# Patient Record
Sex: Female | Born: 1995 | Race: Black or African American | Hispanic: No | Marital: Single | Smoking: Never smoker
Health system: Southern US, Community
[De-identification: ages and names within clinical notes are randomized; demographics above are authoritative.]

## PROBLEM LIST (undated history)

## (undated) DIAGNOSIS — N289 Disorder of kidney and ureter, unspecified: Secondary | ICD-10-CM

## (undated) DIAGNOSIS — R809 Proteinuria, unspecified: Secondary | ICD-10-CM

---
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
## 2017-01-15 ENCOUNTER — Emergency Department (HOSPITAL_COMMUNITY)
Admission: EM | Admit: 2017-01-15 | Discharge: 2017-01-15 | Disposition: A | Discharge status: Home / Self Care | Attending: Emergency Medicine | Admitting: Emergency Medicine

## 2017-01-15 ENCOUNTER — Encounter (HOSPITAL_COMMUNITY): Payer: Self-pay | Admitting: *Deleted

## 2017-01-15 DIAGNOSIS — J029 Acute pharyngitis, unspecified: Secondary | ICD-10-CM | POA: Insufficient documentation

## 2017-01-15 HISTORY — DX: Disorder of kidney and ureter, unspecified: N28.9

## 2017-01-15 LAB — RAPID STREP SCREEN (MED CTR MEBANE ONLY): Streptococcus, Group A Screen (Direct): NEGATIVE

## 2017-01-15 MED ORDER — DEXAMETHASONE SODIUM PHOSPHATE 10 MG/ML IJ SOLN
10.0000 mg | Freq: Once | INTRAMUSCULAR | Status: AC
  Administered 2017-01-15: 10 mg via INTRAMUSCULAR
  Filled 2017-01-15: qty 1

## 2017-01-15 NOTE — Discharge Instructions (Signed)
Take ibuprofen and Tylenol as needed for pain and aches. Use warm water salt gargles, warm teas, honey, and over-the-counter lozenges for your sore throat. Drink plenty of fluids and get plenty of rest. Follow-up with primary care physician if symptoms persist. Return to the ED immediately if any concerning signs or symptoms develop such as throat tightness, facial swelling, difficulty breathing or swallowing, or drooling.

## 2017-01-15 NOTE — ED Notes (Signed)
Declined W/C at D/C and was escorted to lobby by RN. 

## 2017-01-15 NOTE — ED Triage Notes (Signed)
PT reports a sore throat that started 3 days ago. Pt reports there are no other Sx's. Pt reports she had a throat infection this time last year.

## 2017-01-15 NOTE — ED Provider Notes (Signed)
[facility] Provider Note   CSN: [id number] Arrival date & time: 01/15/17  1245     History   Chief Complaint Chief Complaint  Patient presents with  . Sore Throat    HPI Candice Henry is a 21 y.o. female who presents with chief complaint acute onset, progressively worsening sore throat for 3 days. She endorses pain with swallowing, however she states she is able to get food and drink down without difficulty. No aggravating or alleviating factors noted. She denies fevers, chills, SOB, cough, nasal congestion, ear pain, headaches, CP, abdominal pain, n/v/d. She has not tried anything for her symptoms.  The history is provided by the patient.    Past Medical History:  Diagnosis Date  . Renal disorder    Chronic protien uria    There are no active problems to display for this patient.   History reviewed. No pertinent surgical history.  OB History    No data available       Home Medications    Prior to Admission medications   Medication Sig Start Date End Date Taking? Authorizing Provider  amoxicillin (AMOXIL) 500 MG tablet Take 2 tablets (1,000 mg total) by mouth 2 (two) times daily. 03/29/14   Wayland Salinas, MD  fluticasone (CUTIVATE) 0.005 % ointment Apply 1 application topically 2 (two) times daily. Patient taking differently: Apply 1 application topically 2 (two) times daily. Apply to face 02/15/14   Linna Hoff, MD  HYDROcodone-acetaminophen (NORCO) 5-325 MG per tablet Take 2 tablets by mouth every 4 (four) hours as needed. 03/29/14   Wayland Salinas, MD  metoCLOPramide (REGLAN) 10 MG tablet Take 1 tablet (10 mg total) by mouth every 6 (six) hours as needed for nausea (nausea/headache). 03/29/14   Wayland Salinas, MD  ondansetron (ZOFRAN ODT) 8 MG disintegrating tablet 8mg  ODT q4 hours prn nausea 03/29/14   Wayland Salinas, MD  predniSONE (DELTASONE) 20 MG tablet 2 tabs po daily x 3 days 03/29/14   Wayland Salinas, MD    Family History History reviewed. No pertinent  family history.  Social History Social History  Substance Use Topics  . Smoking status: Never Smoker  . Smokeless tobacco: Never Used  . Alcohol use Yes     Comment: social     Allergies   Other   Review of Systems Review of Systems  Constitutional: Negative for chills and fever.  HENT: Positive for sore throat and trouble swallowing. Negative for congestion and facial swelling.   Respiratory: Negative for shortness of breath.   Cardiovascular: Negative for chest pain.  Gastrointestinal: Negative for abdominal pain, diarrhea, nausea and vomiting.  Musculoskeletal: Negative for myalgias, neck pain and neck stiffness.  Neurological: Negative for headaches.     Physical Exam Updated Vital Signs BP 121/76 (BP Location: Right Arm)   Pulse 73   Temp 98.2 F (36.8 C) (Oral)   Resp 16   Ht 5\' 4"  (1.626 m)   Wt 69.4 kg (153 lb)   LMP 01/15/2017   SpO2 100%   BMI 26.26 kg/m   Physical Exam  Constitutional: She appears well-developed and well-nourished. No distress.  Resting comfortably in bed  HENT:  Head: Normocephalic and atraumatic.  Right Ear: External ear normal.  Left Ear: External ear normal.  No frontal or maxillary sinus TTP. TMs normal bilaterally without erythema or bulging. Nasal septum is midline with pink mucosa and no nasal congestion. Posterior oropharynx with mild erythema and tonsillar hypertrophy. No tonsillar exudates or uvular deviation. No trismus or  sublingual abnormalities.  Eyes: Conjunctivae are normal. Right eye exhibits no discharge. Left eye exhibits no discharge.  Neck: Normal range of motion. Neck supple. No JVD present. No tracheal deviation present.  Cardiovascular: Normal rate.   Pulmonary/Chest: Effort normal.  Abdominal: She exhibits no distension.  Musculoskeletal: She exhibits no edema.  Lymphadenopathy:    She has no cervical adenopathy.  Neurological: She is alert.  Skin: Skin is warm and dry. No erythema.  Psychiatric: She has  a normal mood and affect. Her behavior is normal.  Nursing note and vitals reviewed.    ED Treatments / Results  Labs (all labs ordered are listed, but only abnormal results are displayed) Labs Reviewed  RAPID STREP SCREEN (NOT AT [facility])  CULTURE, GROUP A STREP [facility])    EKG  EKG Interpretation None       Radiology No results found.  Procedures Procedures (including critical care time)  Medications Ordered in ED Medications  dexamethasone (DECADRON) injection 10 mg (10 mg Intramuscular Given 01/15/17 1415)     Initial Impression / Assessment and Plan / ED Course  I have reviewed the triage vital signs and the nursing notes.  Pertinent labs & imaging results that were available during my care of the patient were reviewed by me and considered in my medical decision making (see chart for details).     Pt afebrile without tonsillar exudate, negative strep. Presents with mild dysphagia; diagnosis of viral pharyngitis. No abx indicated. DC w symptomatic tx for pain. Pt does not appear dehydrated, but did discuss importance of water rehydration. Presentation non concerning for PTA or infxn spread to soft tissue. No trismus or uvula deviation. Pt able to drink water in ED without difficulty with intact air way. Recommended PCP follow up. Discussed indications for return to the ED. Pt verbalized understanding of and agreement with plan and is safe for discharge home at this time.  Final Clinical Impressions(s) / ED Diagnoses   Final diagnoses:  Viral pharyngitis    New Prescriptions New Prescriptions   No medications on file     Bennye Alm 01/15/17 1436    Geoffery Lyons, MD 01/15/17 [phone]

## 2017-01-17 LAB — CULTURE, GROUP A STREP (THRC)

## 2017-03-17 ENCOUNTER — Encounter (HOSPITAL_COMMUNITY): Payer: Self-pay

## 2017-03-17 ENCOUNTER — Emergency Department (HOSPITAL_COMMUNITY)
Admission: EM | Admit: 2017-03-17 | Discharge: 2017-03-18 | Disposition: A | Discharge status: Home / Self Care | Payer: Self-pay | Attending: Emergency Medicine | Admitting: Emergency Medicine

## 2017-03-17 DIAGNOSIS — Z79899 Other long term (current) drug therapy: Secondary | ICD-10-CM | POA: Insufficient documentation

## 2017-03-17 DIAGNOSIS — J029 Acute pharyngitis, unspecified: Secondary | ICD-10-CM | POA: Insufficient documentation

## 2017-03-17 LAB — RAPID STREP SCREEN (MED CTR MEBANE ONLY): STREPTOCOCCUS, GROUP A SCREEN (DIRECT): NEGATIVE

## 2017-03-17 MED ORDER — DEXAMETHASONE SODIUM PHOSPHATE 10 MG/ML IJ SOLN
10.0000 mg | Freq: Once | INTRAMUSCULAR | Status: AC
  Administered 2017-03-17: 10 mg via INTRAMUSCULAR
  Filled 2017-03-17: qty 1

## 2017-03-17 MED ORDER — AMOXICILLIN 500 MG PO CAPS: 500.0000 mg | ORAL_CAPSULE | Freq: Two times a day (BID) | ORAL | 0 refills | Status: AC

## 2017-03-17 NOTE — Discharge Instructions (Signed)
It was my pleasure taking care of you today!   Please take all of your antibiotics until finished!   Discard your toothbrush and begin using a new one in 3 days.   For sore throat, take ibuprofen every 6 hours as needed.   Follow up with your doctor in 2-3 days if no improvement.   Return to the ED sooner for worsening condition, inability to swallow, breathing difficulty, new concerns.

## 2017-03-17 NOTE — ED Triage Notes (Signed)
Pt states sore throat X3 days. She reports hx of strep throat. Tonsillar inflammation and some white patches noted. Voice slightly hoarse. Airway intact.

## 2017-03-17 NOTE — ED Provider Notes (Signed)
[facility] EMERGENCY DEPARTMENT Provider Note   CSN: [id number] Arrival date & time: 03/17/17  5784     History   Chief Complaint Chief Complaint  Patient presents with  . Sore Throat    HPI Candice Henry is a 21 y.o. female.  The history is provided by the patient and medical records. No language interpreter was used.  Sore Throat  Pertinent negatives include no chest pain, no abdominal pain, no headaches and no shortness of breath.   Candice Henry is a 21 y.o. female who presents to the Emergency Department complaining of constant, persistent  sore throat x 3 days. Pain worse when swallowing. OTC pain medication tried with little improvement. No fever, chills, neck pain, abdominal pain, n/v/d, chest pain, shortness of breath. Able to tolerate fluids and soft foods without difficulty. 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   Past Medical History:  Diagnosis Date  . Renal disorder    Chronic protien uria    There are no active problems to display for this patient.   History reviewed. No pertinent surgical history.  OB History    No data available       Home Medications    Prior to Admission medications   Medication Sig Start Date End Date Taking? Authorizing Provider  amoxicillin (AMOXIL) 500 MG capsule Take 1 capsule (500 mg total) by mouth 2 (two) times daily. 03/17/17   Raedyn Klinck, Chase Picket, PA-C  fluticasone (CUTIVATE) 0.005 % ointment Apply 1 application topically 2 (two) times daily. Patient taking differently: Apply 1 application topically 2 (two) times daily. Apply to face 02/15/14   Linna Hoff, MD  HYDROcodone-acetaminophen (NORCO) 5-325 MG per tablet Take 2 tablets by mouth every 4 (four) hours as needed. 03/29/14   Wayland Salinas, MD  metoCLOPramide (REGLAN) 10 MG tablet Take 1 tablet (10 mg total) by mouth every 6 (six) hours as needed for nausea (nausea/headache).  03/29/14   Wayland Salinas, MD  ondansetron (ZOFRAN ODT) 8 MG disintegrating tablet 8mg  ODT q4 hours prn nausea 03/29/14   Wayland Salinas, MD  predniSONE (DELTASONE) 20 MG tablet 2 tabs po daily x 3 days 03/29/14   Wayland Salinas, MD    Family History History reviewed. No pertinent family history.  Social History Social History  Substance Use Topics  . Smoking status: Never Smoker  . Smokeless tobacco: Never Used  . Alcohol use Yes     Comment: social     Allergies   Other   Review of Systems Review of Systems  Constitutional: Negative for chills and fever.  HENT: Positive for congestion and sore throat. Negative for facial swelling and trouble swallowing.   Respiratory: Negative for cough and shortness of breath.   Cardiovascular: Negative for chest pain.  Gastrointestinal: Negative for abdominal pain, diarrhea, nausea and vomiting.  Musculoskeletal: Negative for neck pain.  Allergic/Immunologic: Negative for immunocompromised state.  Neurological: Negative for headaches.     Physical Exam Updated Vital Signs BP 120/84 (BP Location: Left Arm)   Pulse 78   Temp 98.5 F (36.9 C) (Oral)   Resp 16   LMP 02/10/2017 (Approximate)   SpO2 100%   Physical Exam  Constitutional: She appears well-developed and well-nourished. No distress.  HENT:  Head: Normocephalic and atraumatic.  OP with erythema, tonsillar hypertrophy and bilateral exudates. No PTA. Uvula midline.  Neck: Neck supple.  Cardiovascular: Normal rate, regular rhythm and normal  heart sounds.   No murmur heard. Pulmonary/Chest: Effort normal and breath sounds normal. No respiratory distress. She has no wheezes. She has no rales.  Musculoskeletal: Normal range of motion.  Neurological: She is alert.  Skin: Skin is warm and dry.  Nursing note and vitals reviewed.    ED Treatments / Results  Labs (all labs ordered are listed, but only abnormal results are displayed) Labs Reviewed  RAPID STREP SCREEN (NOT AT  [facility])  CULTURE, GROUP A STREP [facility])    EKG  EKG Interpretation None       Radiology No results found.  Procedures Procedures (including critical care time)  Medications Ordered in ED Medications  dexamethasone (DECADRON) injection 10 mg (not administered)     Initial Impression / Assessment and Plan / ED Course  I have reviewed the triage vital signs and the nursing notes.  Pertinent labs & imaging results that were available during my care of the patient were reviewed by me and considered in my medical decision making (see chart for details).    Candice CouncilmanJulian Alcivar is a 21 y.o. female who presents to ED for sore throat x 3 days. On exam, patient is afebrile, hemodynamically stable with clear lung exam. OP with erythema, tonsillar hypertrophy and bilateral exudates. Midline uvula. No PTA. Tolerating PO and secretions fine. Rapid strep negative, but given exam findings, will start on amoxil. Follow up with PCP in 2-3 days if no improvement. Reasons to return to ER discussed and all questions answered.   Final Clinical Impressions(s) / ED Diagnoses   Final diagnoses:  Exudative pharyngitis    New Prescriptions New Prescriptions   AMOXICILLIN (AMOXIL) 500 MG CAPSULE    Take 1 capsule (500 mg total) by mouth 2 (two) times daily.     Majesta Leichter, Chase PicketJaime Pilcher, PA-C 03/17/17 [id number]    Pricilla LovelessGoldston, Scott, MD 03/17/17 [phone]

## 2017-03-19 LAB — CULTURE, GROUP A STREP (THRC)

## 2017-07-09 ENCOUNTER — Other Ambulatory Visit: Payer: Self-pay

## 2017-07-09 ENCOUNTER — Ambulatory Visit (HOSPITAL_COMMUNITY)
Admission: EM | Admit: 2017-07-09 | Discharge: 2017-07-09 | Disposition: A | Discharge status: Home / Self Care | Payer: Self-pay | Attending: Family Medicine | Admitting: Family Medicine

## 2017-07-09 ENCOUNTER — Encounter (HOSPITAL_COMMUNITY): Payer: Self-pay | Admitting: Emergency Medicine

## 2017-07-09 DIAGNOSIS — R112 Nausea with vomiting, unspecified: Secondary | ICD-10-CM

## 2017-07-09 MED ORDER — ONDANSETRON 4 MG PO TBDP: 4.0000 mg | ORAL_TABLET | Freq: Three times a day (TID) | ORAL | 0 refills | Status: AC | PRN

## 2017-07-09 NOTE — ED Triage Notes (Signed)
Pt states yesterday she thought she had food poisoning and was vomiting a lot, now shes been having diarrhea and body aches.

## 2017-07-12 NOTE — ED Provider Notes (Signed)
  [facility]   [id number] 07/09/17 Arrival Time: 1938  ASSESSMENT & PLAN:  1. Nausea and vomiting, intractability of vomiting not specified, unspecified vomiting type    Meds ordered this encounter  Medications  . ondansetron (ZOFRAN-ODT) 4 MG disintegrating tablet    Sig: Take 1 tablet (4 mg total) by mouth every 8 (eight) hours as needed for nausea or vomiting.    Dispense:  15 tablet    Refill:  0  Suspect viral etiology. Will do her best to ensure adequate fluid intake in order to avoid dehydration. Will proceed to the Emergency Department for evaluation if unable to tolerate PO fluids regularly.  Otherwise she ill f/u with his PCP or here if not showing improvement over the next 48-72 hours.  Reviewed expectations re: course of current medical issues. Questions answered. Outlined signs and symptoms indicating need for more acute intervention. Patient verbalized understanding. After Visit Summary given.   SUBJECTIVE: History from: patient.  Candice Henry is a 22 y.o. female who presents with complaint of intermittent nausea and vomiting of brown material. Onset abrupt, yesterday. Abdominal discomfort: mild and cramping. Symptoms are gradually improving since beginning. Some loose stools today. Aggravating factors: eating. Alleviating factors: none. Associated symptoms: anorexia. She denies chills and fever. Appetite: decreased. PO intake: decreased. Ambulatory without assistance. Urinary symptoms: none. Last bowel movement today without blood. OTC treatment: none.  Patient's last menstrual period was 06/15/2017.  History reviewed. No pertinent surgical history.  ROS: As per HPI.  OBJECTIVE:  Vitals:   07/09/17 2026  BP: 122/83  Pulse: 94  Resp: 18  Temp: 99 F (37.2 C)  SpO2: 98%    General appearance: alert; no distress Lungs: clear to auscultation bilaterally Heart: regular rate and rhythm Abdomen: soft; non-distended; no significant abdominal  tenderness, "just a cramping feeling"; bowel sounds present; no masses or organomegaly; no guarding or rebound tenderness Back: no CVA tenderness Extremities: no edema; symmetrical with no gross deformities Skin: warm and dry Neurologic: normal gait Psychological: alert and cooperative; normal mood and affect  Allergies  Allergen Reactions  . Other Rash    Face breaks out                                               Past Medical History:  Diagnosis Date  . Renal disorder    Chronic protien uria   Social History   Socioeconomic History  . Marital status: Single    Spouse name: Not on file  . Number of children: Not on file  . Years of education: Not on file  . Highest education level: Not on file  Social Needs  . Financial resource strain: Not on file  . Food insecurity - worry: Not on file  . Food insecurity - inability: Not on file  . Transportation needs - medical: Not on file  . Transportation needs - non-medical: Not on file  Occupational History  . Not on file  Tobacco Use  . Smoking status: Never Smoker  . Smokeless tobacco: Never Used  Substance and Sexual Activity  . Alcohol use: Yes    Comment: social  . Drug use: No  . Sexual activity: No  Other Topics Concern  . Not on file  Social History Narrative  . Not on file     Mardella LaymanHagler, Gaile Allmon, MD 07/12/17 1158

## 2017-08-07 ENCOUNTER — Encounter (HOSPITAL_COMMUNITY): Payer: Self-pay | Admitting: Emergency Medicine

## 2017-08-07 DIAGNOSIS — R109 Unspecified abdominal pain: Secondary | ICD-10-CM | POA: Insufficient documentation

## 2017-08-07 DIAGNOSIS — Z5321 Procedure and treatment not carried out due to patient leaving prior to being seen by health care provider: Secondary | ICD-10-CM | POA: Insufficient documentation

## 2017-08-07 LAB — URINALYSIS, ROUTINE W REFLEX MICROSCOPIC
Bilirubin Urine: NEGATIVE
Glucose, UA: NEGATIVE mg/dL
Hgb urine dipstick: NEGATIVE
Ketones, ur: NEGATIVE mg/dL
LEUKOCYTES UA: NEGATIVE
Nitrite: NEGATIVE
PH: 5 (ref 5.0–8.0)
Protein, ur: NEGATIVE mg/dL
Specific Gravity, Urine: 1.024 (ref 1.005–1.030)

## 2017-08-07 LAB — I-STAT BETA HCG BLOOD, ED (MC, WL, AP ONLY): I-stat hCG, quantitative: <5 m[IU]/mL (ref <5)

## 2017-08-07 LAB — BASIC METABOLIC PANEL
Anion gap: 11 (ref 5–15)
BUN: 15 mg/dL (ref 6–20)
CHLORIDE: 106 mmol/L (ref 101–111)
CO2: 21 mmol/L — ABNORMAL LOW (ref 22–32)
Calcium: 9.3 mg/dL (ref 8.9–10.3)
Creatinine, Ser: 0.97 mg/dL (ref 0.44–1.00)
GFR calc Af Amer: >60 mL/min (ref >60)
Glucose, Bld: 82 mg/dL (ref 65–99)
POTASSIUM: 4.2 mmol/L (ref 3.5–5.1)
SODIUM: 138 mmol/L (ref 135–145)

## 2017-08-07 LAB — CBC
HCT: 37.3 % (ref 36.0–46.0)
Hemoglobin: 12.4 g/dL (ref 12.0–15.0)
MCH: 31.1 pg (ref 26.0–34.0)
MCHC: 33.2 g/dL (ref 30.0–36.0)
MCV: 93.5 fL (ref 78.0–100.0)
PLATELETS: 270 10*3/uL (ref 150–400)
RBC: 3.99 MIL/uL (ref 3.87–5.11)
RDW: 12.7 % (ref 11.5–15.5)
WBC: 6 10*3/uL (ref 4.0–10.5)

## 2017-08-07 NOTE — ED Triage Notes (Signed)
Pt states right sided flank pain since Sunday radiating into the left, denies painful urination. Pain 6/10. Denies vaginal symptoms. LMP 2/22.

## 2017-08-08 ENCOUNTER — Encounter (HOSPITAL_COMMUNITY): Payer: Self-pay | Admitting: Emergency Medicine

## 2017-08-08 ENCOUNTER — Emergency Department (HOSPITAL_COMMUNITY)
Admission: EM | Admit: 2017-08-08 | Discharge: 2017-08-08 | Disposition: A | Discharge status: Home / Self Care | Payer: Self-pay | Attending: Emergency Medicine | Admitting: Emergency Medicine

## 2017-08-08 DIAGNOSIS — R1031 Right lower quadrant pain: Secondary | ICD-10-CM | POA: Insufficient documentation

## 2017-08-08 DIAGNOSIS — R109 Unspecified abdominal pain: Secondary | ICD-10-CM

## 2017-08-08 HISTORY — DX: Proteinuria, unspecified: R80.9

## 2017-08-08 LAB — CBC
HCT: 38 % (ref 36.0–46.0)
HEMOGLOBIN: 12.4 g/dL (ref 12.0–15.0)
MCH: 30.7 pg (ref 26.0–34.0)
MCHC: 32.6 g/dL (ref 30.0–36.0)
MCV: 94.1 fL (ref 78.0–100.0)
Platelets: 281 10*3/uL (ref 150–400)
RBC: 4.04 MIL/uL (ref 3.87–5.11)
RDW: 12.8 % (ref 11.5–15.5)
WBC: 6.6 10*3/uL (ref 4.0–10.5)

## 2017-08-08 LAB — URINALYSIS, ROUTINE W REFLEX MICROSCOPIC
Bilirubin Urine: NEGATIVE
Glucose, UA: NEGATIVE mg/dL
HGB URINE DIPSTICK: NEGATIVE
Ketones, ur: 5 mg/dL — AB
Leukocytes, UA: NEGATIVE
Nitrite: NEGATIVE
PH: 6 (ref 5.0–8.0)
Protein, ur: NEGATIVE mg/dL
SPECIFIC GRAVITY, URINE: 1.02 (ref 1.005–1.030)

## 2017-08-08 LAB — COMPREHENSIVE METABOLIC PANEL
ALT: 12 U/L — ABNORMAL LOW (ref 14–54)
AST: 16 U/L (ref 15–41)
Albumin: 4 g/dL (ref 3.5–5.0)
Alkaline Phosphatase: 70 U/L (ref 38–126)
Anion gap: 8 (ref 5–15)
BUN: 18 mg/dL (ref 6–20)
CO2: 29 mmol/L (ref 22–32)
Calcium: 9.6 mg/dL (ref 8.9–10.3)
Chloride: 103 mmol/L (ref 101–111)
Creatinine, Ser: 0.88 mg/dL (ref 0.44–1.00)
GFR calc Af Amer: >60 mL/min (ref >60)
GFR calc non Af Amer: >60 mL/min (ref >60)
Glucose, Bld: 84 mg/dL (ref 65–99)
Potassium: 3.8 mmol/L (ref 3.5–5.1)
Sodium: 140 mmol/L (ref 135–145)
Total Bilirubin: 1 mg/dL (ref 0.3–1.2)
Total Protein: 7.5 g/dL (ref 6.5–8.1)

## 2017-08-08 LAB — LIPASE, BLOOD: Lipase: 43 U/L (ref 11–51)

## 2017-08-08 LAB — I-STAT BETA HCG BLOOD, ED (MC, WL, AP ONLY): I-stat hCG, quantitative: <5 m[IU]/mL (ref <5)

## 2017-08-08 NOTE — ED Triage Notes (Signed)
Patient c/o bilateral flank pain with burning in abdomen x5 days. Denies N/V/D. Denies urinary sx.

## 2017-08-08 NOTE — ED Notes (Signed)
Bed: WTR8 Expected date:  Expected time:  Means of arrival:  Comments: 

## 2017-08-08 NOTE — ED Provider Notes (Signed)
[facility]-EMERGENCY DEPT Provider Note   CSN: [id number] Arrival date & time: 08/08/17  1354     History   Chief Complaint Chief Complaint  Patient presents with  . Abdominal Pain  . Flank Pain    HPI Candice Henry is a 22 y.o. female.  HPI   Patient is a 22 year old female with history of postural proteinuria who presents the ED to be evaluated for right flank pain that began 5 days ago.  Patient states pain was initially located bilaterally, however now is only located in the right flank. Pain does not radiate. Rates pain 8/10, however states that it has resolved since she arrived to the emergency department.  Prior to resolution, pain was constant and felt like burning/aching.  States she has had this pain in the past, but has never persisted for this long.  States that in the past she will have pain when she is dehydrated, and symptoms are usually resolved after she rehydrates.  States she has been drinking water to help improve her pain.  She also reports a burning pain to the epigastric region that was initially 6/10, but has now improved and she is no longer having pain.  She denies any chest pain or shortness of breath.  She denies any history of acid reflux.  Denies eating spicy foods.  She denies any nausea, vomiting, diarrhea, blood in stool, fevers, chest pain, shortness of breath, no numbness, weakness, tingling to the bilateral lower extremities.  Denies any pain to the middle of her back.  Denies any urinary or vaginal symptoms including no sick dysuria, frequency, hematuria, discoloration to her urine.  Denies any vaginal bleeding, vaginal discharge, vaginal irritation.  No pelvic pain. LMP 2/22.  She reports that she has a history of postural proteinuria and has been [facility] for several years.  She was last seen about 1 year ago.  States she has had multiple ultrasounds of the kidneys in the past which have been negative  for any anatomical abnormality.  Past Medical History:  Diagnosis Date  . Proteinuria   . Renal disorder    Chronic protien uria    There are no active problems to display for this patient.   History reviewed. No pertinent surgical history.  OB History    No data available       Home Medications    Prior to Admission medications   Medication Sig Start Date End Date Taking? Authorizing Provider  amoxicillin (AMOXIL) 500 MG capsule Take 1 capsule (500 mg total) by mouth 2 (two) times daily. 03/17/17   Ward, Chase PicketJaime Pilcher, PA-C  fluticasone (CUTIVATE) 0.005 % ointment Apply 1 application topically 2 (two) times daily. Patient taking differently: Apply 1 application topically 2 (two) times daily. Apply to face 02/15/14   Linna HoffKindl, James D, MD  HYDROcodone-acetaminophen (NORCO) 5-325 MG per tablet Take 2 tablets by mouth every 4 (four) hours as needed. 03/29/14   Wayland SalinasBednar, John, MD  metoCLOPramide (REGLAN) 10 MG tablet Take 1 tablet (10 mg total) by mouth every 6 (six) hours as needed for nausea (nausea/headache). 03/29/14   Wayland SalinasBednar, John, MD  ondansetron (ZOFRAN-ODT) 4 MG disintegrating tablet Take 1 tablet (4 mg total) by mouth every 8 (eight) hours as needed for nausea or vomiting. 07/09/17   Mardella LaymanHagler, Brian, MD  predniSONE (DELTASONE) 20 MG tablet 2 tabs po daily x 3 days 03/29/14   Wayland SalinasBednar, John, MD    Family History No family history on file.  Social History Social History   Tobacco Use  . Smoking status: Never Smoker  . Smokeless tobacco: Never Used  Substance Use Topics  . Alcohol use: Yes    Comment: social  . Drug use: No     Allergies   Other   Review of Systems Review of Systems  Constitutional: Negative for fever.  HENT: Negative for sore throat.   Eyes: Negative for visual disturbance.  Respiratory: Negative for shortness of breath.   Cardiovascular: Negative for chest pain.  Gastrointestinal: Positive for abdominal pain (resolved). Negative for blood in stool,  constipation, diarrhea, nausea and vomiting.  Genitourinary: Positive for flank pain. Negative for difficulty urinating, dysuria, frequency, hematuria, menstrual problem, pelvic pain, urgency, vaginal bleeding and vaginal discharge.  Musculoskeletal: Negative for neck pain.  Neurological: Negative for headaches.     Physical Exam Updated Vital Signs BP 129/87 (BP Location: Left Arm)   Pulse 74   Temp 98 F (36.7 C) (Oral)   Resp 18   Ht 5\' 4"  (1.626 m)   Wt 69.4 kg (153 lb)   LMP 07/18/2017   SpO2 100%   BMI 26.26 kg/m   Physical Exam  Constitutional: She appears well-developed and well-nourished. No distress.  Nontoxic, no acute distress  HENT:  Head: Normocephalic and atraumatic.  Eyes: Conjunctivae are normal.  Neck: Neck supple.  Cardiovascular: Normal rate, regular rhythm and normal heart sounds.  No murmur heard. Pulmonary/Chest: Effort normal and breath sounds normal. No respiratory distress. She has no wheezes.  Abdominal: Soft. Normal appearance and bowel sounds are normal. There is no tenderness. There is no rigidity, no rebound, no guarding and no CVA tenderness.  Musculoskeletal: She exhibits no edema.  Neurological: She is alert.  Skin: Skin is warm and dry.  Psychiatric: She has a normal mood and affect.  Nursing note and vitals reviewed.    ED Treatments / Results  Labs (all labs ordered are listed, but only abnormal results are displayed) Labs Reviewed  COMPREHENSIVE METABOLIC PANEL - Abnormal; Notable for the following components:      Result Value   ALT 12 (*)    All other components within normal limits  URINALYSIS, ROUTINE W REFLEX MICROSCOPIC - Abnormal; Notable for the following components:   Ketones, ur 5 (*)    All other components within normal limits  LIPASE, BLOOD  CBC  I-STAT BETA HCG BLOOD, ED ([facility], [facility], [facility] ONLY)    EKG  EKG Interpretation None       Radiology No results found.  Procedures Procedures (including critical  care time)  Medications Ordered in ED Medications - No data to display   Initial Impression / Assessment and Plan / ED Course  I have reviewed the triage vital signs and the nursing notes.  Pertinent labs & imaging results that were available during my care of the patient were reviewed by me and considered in my medical decision making (see chart for details).    Final Clinical Impressions(s) / ED Diagnoses   Final diagnoses:  Right flank pain   Patient is nontoxic, nonseptic appearing, in no apparent distress.  Patient's pain and other symptoms resolved spontaneously while she was waiting in the emergency department.  Labs and vitals reviewed and are within normal limits. Patient does not meet the SIRS or Sepsis criteria.  On exam patient does not have a surgical abdomin and there are no peritoneal signs.  No indication of appendicitis, bowel obstruction, bowel perforation, cholecystitis, diverticulitis, ectopic pregnancy. Doubt ovarian torsion  or abscess. Doubt nephrolithiasis. Patient discharged home with recommendation to call her nephrologist as well as PCP to inform them of visit and f/u. I have also discussed reasons to return immediately to the ER.  Patient expresses understanding and agrees with plan.  ED Discharge Orders    None       Rayne Du 08/08/17 2051    Vanetta Mulders, MD 08/09/17 [phone]

## 2017-08-08 NOTE — Discharge Instructions (Signed)
The results of your lab work today were within normal limits.  There was no protein in your urine and no evidence of a urinary tract infection.  Your white blood cell count was normal.  Your kidney function was normal, your electrolytes were normal.  Please follow-up with your primary care provider and your nephrologist to make them aware of your visit to the emergency department today.  Please return to the emergency department if you have any new or persistent symptoms including any of the following:  Abdominal pain that does not go away.  You have a fever.  You keep throwing up (vomiting).  The pain is felt only in portions of the abdomen. Pain in the right side could possibly be appendicitis. In an adult, pain in the left lower portion of the abdomen could be colitis or diverticulitis.  You pass bloody or black tarry stools.  There is bright red blood in the stool.  The constipation stays for more than 4 days.  There is belly (abdominal) or rectal pain.  You do not seem to be getting better.  You have any questions or concerns.

## 2017-08-08 NOTE — ED Notes (Signed)
Pt did not answer for room.  Pt's sitting near her known location stated that she left.

## 2017-08-11 ENCOUNTER — Other Ambulatory Visit: Payer: Self-pay

## 2017-08-11 ENCOUNTER — Emergency Department (HOSPITAL_COMMUNITY)
Admission: EM | Admit: 2017-08-11 | Discharge: 2017-08-11 | Disposition: A | Discharge status: Home / Self Care | Payer: Self-pay | Attending: Emergency Medicine | Admitting: Emergency Medicine

## 2017-08-11 ENCOUNTER — Emergency Department (HOSPITAL_COMMUNITY): Payer: Self-pay

## 2017-08-11 ENCOUNTER — Encounter (HOSPITAL_COMMUNITY): Payer: Self-pay | Admitting: Emergency Medicine

## 2017-08-11 DIAGNOSIS — R109 Unspecified abdominal pain: Secondary | ICD-10-CM

## 2017-08-11 DIAGNOSIS — R1011 Right upper quadrant pain: Secondary | ICD-10-CM | POA: Insufficient documentation

## 2017-08-11 LAB — URINALYSIS, ROUTINE W REFLEX MICROSCOPIC
Bilirubin Urine: NEGATIVE
Glucose, UA: NEGATIVE mg/dL
Hgb urine dipstick: NEGATIVE
Ketones, ur: 5 mg/dL — AB
Leukocytes, UA: NEGATIVE
NITRITE: NEGATIVE
Protein, ur: NEGATIVE mg/dL
SPECIFIC GRAVITY, URINE: 1.028 (ref 1.005–1.030)
pH: 6 (ref 5.0–8.0)

## 2017-08-11 LAB — BASIC METABOLIC PANEL
ANION GAP: 8 (ref 5–15)
BUN: 19 mg/dL (ref 6–20)
CALCIUM: 9.5 mg/dL (ref 8.9–10.3)
CO2: 26 mmol/L (ref 22–32)
Chloride: 103 mmol/L (ref 101–111)
Creatinine, Ser: 0.75 mg/dL (ref 0.44–1.00)
GLUCOSE: 93 mg/dL (ref 65–99)
Potassium: 3.8 mmol/L (ref 3.5–5.1)
Sodium: 137 mmol/L (ref 135–145)

## 2017-08-11 LAB — CBC
HCT: 38.9 % (ref 36.0–46.0)
HEMOGLOBIN: 12.6 g/dL (ref 12.0–15.0)
MCH: 30.7 pg (ref 26.0–34.0)
MCHC: 32.4 g/dL (ref 30.0–36.0)
MCV: 94.6 fL (ref 78.0–100.0)
Platelets: 283 10*3/uL (ref 150–400)
RBC: 4.11 MIL/uL (ref 3.87–5.11)
RDW: 13.1 % (ref 11.5–15.5)
WBC: 6.9 10*3/uL (ref 4.0–10.5)

## 2017-08-11 LAB — I-STAT BETA HCG BLOOD, ED (MC, WL, AP ONLY)

## 2017-08-11 MED ORDER — TRAMADOL HCL 50 MG PO TABS: 50.0000 mg | ORAL_TABLET | Freq: Four times a day (QID) | ORAL | 0 refills | Status: AC | PRN

## 2017-08-11 MED ORDER — SODIUM CHLORIDE 0.9 % IJ SOLN
INTRAMUSCULAR | Status: AC
  Filled 2017-08-11: qty 50

## 2017-08-11 MED ORDER — IOPAMIDOL (ISOVUE-300) INJECTION 61%
100.0000 mL | Freq: Once | INTRAVENOUS | Status: AC | PRN
  Administered 2017-08-11: 100 mL via INTRAVENOUS

## 2017-08-11 MED ORDER — SODIUM CHLORIDE 0.9 % IV BOLUS (SEPSIS)
1000.0000 mL | Freq: Once | INTRAVENOUS | Status: AC
  Administered 2017-08-11: 1000 mL via INTRAVENOUS

## 2017-08-11 MED ORDER — IOPAMIDOL (ISOVUE-300) INJECTION 61%
INTRAVENOUS | Status: AC
  Filled 2017-08-11: qty 100

## 2017-08-11 MED ORDER — KETOROLAC TROMETHAMINE 30 MG/ML IJ SOLN
30.0000 mg | Freq: Once | INTRAMUSCULAR | Status: AC
  Administered 2017-08-11: 30 mg via INTRAVENOUS
  Filled 2017-08-11: qty 1

## 2017-08-11 NOTE — Discharge Instructions (Signed)
Return here as needed. Follow up with the GI doctor provided.  °

## 2017-08-11 NOTE — ED Notes (Signed)
Bed: WTR5 Expected date:  Expected time:  Means of arrival:  Comments: 

## 2017-08-11 NOTE — ED Triage Notes (Signed)
Patient complaining of right flank pain and right lower abdominal pain that she has had since last Sunday. Patient is not complaining of any other symptoms.

## 2017-08-16 NOTE — ED Provider Notes (Signed)
[facility]-EMERGENCY DEPT Provider Note   CSN: [id number] Arrival date & time: 08/11/17  0133     History   Chief Complaint Chief Complaint  Patient presents with  . Flank Pain  . Abdominal Pain    HPI Candice Henry is a 22 y.o. female.  HPI Patient presents to the emergency department with right-sided flank pain that started 10 days ago.  The patient states she was seen previously for this 3 days ago.  Patient states that nothing seemed to make the condition better or worse.  Patient states that certain movements do seem to make it worse with palpation.  The patient denies chest pain, shortness of breath, headache,blurred vision, neck pain, fever, cough, weakness, numbness, dizziness, anorexia, edema, nausea, vomiting, diarrhea, rash, back pain, dysuria, hematemesis, bloody stool, near syncope, or syncope.  Patient states she was not given any medications in her prior visit for her symptoms. Past Medical History:  Diagnosis Date  . Proteinuria   . Renal disorder    Chronic protien uria    There are no active problems to display for this patient.   History reviewed. No pertinent surgical history.   OB History   None      Home Medications    Prior to Admission medications   Medication Sig Start Date End Date Taking? Authorizing Provider  ibuprofen (ADVIL,MOTRIN) 200 MG tablet Take 400 mg by mouth every 6 (six) hours as needed for moderate pain.   Yes [provider]  amoxicillin (AMOXIL) 500 MG capsule Take 1 capsule (500 mg total) by mouth 2 (two) times daily. Patient not taking: Reported on 08/11/2017 03/17/17   Ward, Chase Picket, PA-C  fluticasone (CUTIVATE) 0.005 % ointment Apply 1 application topically 2 (two) times daily. Patient not taking: Reported on 08/11/2017 02/15/14   Linna Hoff, MD  HYDROcodone-acetaminophen (NORCO) 5-325 MG per tablet Take 2 tablets by mouth every 4 (four) hours as needed. Patient not taking: Reported  on 08/11/2017 03/29/14   Wayland Salinas, MD  metoCLOPramide (REGLAN) 10 MG tablet Take 1 tablet (10 mg total) by mouth every 6 (six) hours as needed for nausea (nausea/headache). Patient not taking: Reported on 08/11/2017 03/29/14   Wayland Salinas, MD  ondansetron (ZOFRAN-ODT) 4 MG disintegrating tablet Take 1 tablet (4 mg total) by mouth every 8 (eight) hours as needed for nausea or vomiting. Patient not taking: Reported on 08/11/2017 07/09/17   Mardella Layman, MD  predniSONE (DELTASONE) 20 MG tablet 2 tabs po daily x 3 days Patient not taking: Reported on 08/11/2017 03/29/14   Wayland Salinas, MD  traMADol (ULTRAM) 50 MG tablet Take 1 tablet (50 mg total) by mouth every 6 (six) hours as needed for severe pain. 08/11/17   Charlestine Night, PA-C    Family History History reviewed. No pertinent family history.  Social History Social History   Tobacco Use  . Smoking status: Never Smoker  . Smokeless tobacco: Never Used  Substance Use Topics  . Alcohol use: Yes    Comment: social  . Drug use: No     Allergies   Other   Review of Systems Review of Systems All other systems negative except as documented in the HPI. All pertinent positives and negatives as reviewed in the HPI. Physical Exam Updated Vital Signs BP 110/77 (BP Location: Right Arm)   Pulse 81   Temp 97.9 F (36.6 C) (Oral)   Resp 16   Ht 5\' 4"  (1.626 m)   Wt 69.4 kg (153  lb)   LMP 07/18/2017 Comment: neg hcg test 08/11/2017  SpO2 98%   BMI 26.26 kg/m   Physical Exam  Constitutional: She is oriented to person, place, and time. She appears well-developed and well-nourished. No distress.  HENT:  Head: Normocephalic and atraumatic.  Mouth/Throat: Oropharynx is clear and moist.  Eyes: Pupils are equal, round, and reactive to light.  Neck: Normal range of motion. Neck supple.  Cardiovascular: Normal rate, regular rhythm and normal heart sounds. Exam reveals no gallop and no friction rub.  No murmur heard. Pulmonary/Chest:  Effort normal and breath sounds normal. No respiratory distress. She has no wheezes.  Abdominal: Soft. Bowel sounds are normal. She exhibits no distension. There is tenderness.    Neurological: She is alert and oriented to person, place, and time. She exhibits normal muscle tone. Coordination normal.  Skin: Skin is warm and dry. Capillary refill takes less than 2 seconds. No rash noted. No erythema.  Psychiatric: She has a normal mood and affect. Her behavior is normal.  Nursing note and vitals reviewed.    ED Treatments / Results  Labs (all labs ordered are listed, but only abnormal results are displayed) Labs Reviewed  URINALYSIS, ROUTINE W REFLEX MICROSCOPIC - Abnormal; Notable for the following components:      Result Value   Ketones, ur 5 (*)    Bacteria, UA RARE (*)    Squamous Epithelial / LPF 0-5 (*)    All other components within normal limits  BASIC METABOLIC PANEL  CBC  I-STAT BETA HCG BLOOD, ED ([facility], [facility], [facility] ONLY)    EKG None  Radiology No results found.  Procedures Procedures (including critical care time)  Medications Ordered in ED Medications  sodium chloride 0.9 % bolus 1,000 mL (0 mLs Intravenous Stopped 08/11/17 1021)  iopamidol (ISOVUE-300) 61 % injection 100 mL (100 mLs Intravenous Contrast Given 08/11/17 0836)  ketorolac (TORADOL) 30 MG/ML injection 30 mg (30 mg Intravenous Given 08/11/17 1022)     Initial Impression / Assessment and Plan / ED Course  I have reviewed the triage vital signs and the nursing notes.  Pertinent labs & imaging results that were available during my care of the patient were reviewed by me and considered in my medical decision making (see chart for details).    Patient CT scan did not show any significant abnormalities.  The patient will be treated for what may be a muscular/GI irritation.  Have advised the patient to return here for any worsening in her condition.  Given her follow-up with GI as well.  Patient agrees the plan  and all questions were answered.  Final Clinical Impressions(s) / ED Diagnoses   Final diagnoses:  Right flank pain    ED Discharge Orders        Ordered    traMADol (ULTRAM) 50 MG tablet  Every 6 hours PRN     08/11/17 [address]1109       Myria Steenbergen, PA-C 08/16/17 0050    Cathren LaineSteinl, Kevin, MD 08/16/17 [phone]

## 2018-08-11 IMAGING — CT CT ABD-PELV W/ CM
2 of 4 series · 17 of 46 positions shown, 19 images · IV contrast (ISOVUE)
Comparison: None.

CLINICAL DATA: Right flank pain and right lower abdominal pain 1
week.

EXAM:
CT ABDOMEN AND PELVIS WITH CONTRAST
TECHNIQUE: Multidetector CT imaging of the abdomen and pelvis was performed
using the standard protocol following bolus administration of
intravenous contrast.
CONTRAST:  100mL [id number] IOPAMIDOL ([id number]) INJECTION 61%

[Series 2: axial st · axial · 0.68mm/px · z∈[+1347,+1722]mm · 14 of 83 slices shown, 16 images]
[im 4/83  soft-tissue]
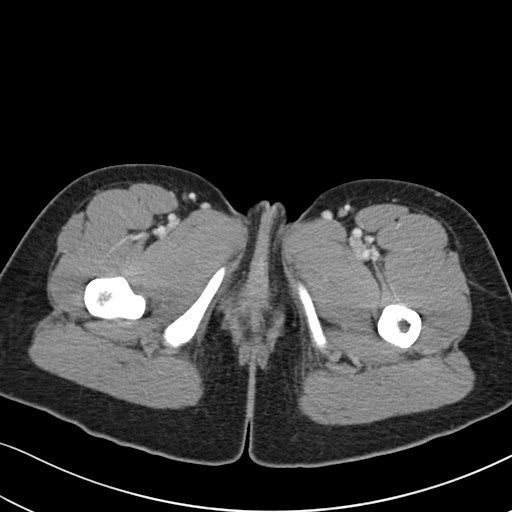
[im 4/83  bone]
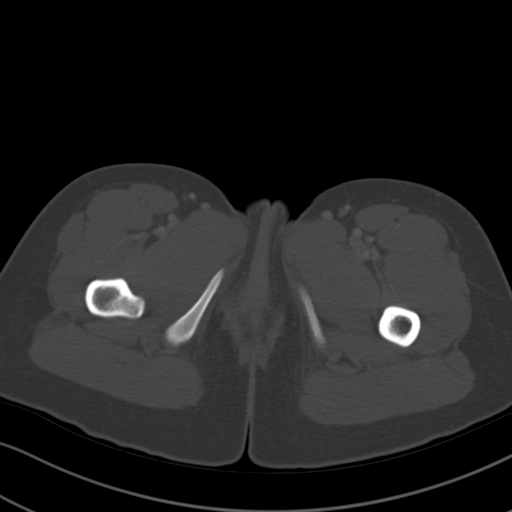
[im 12/83  soft-tissue]
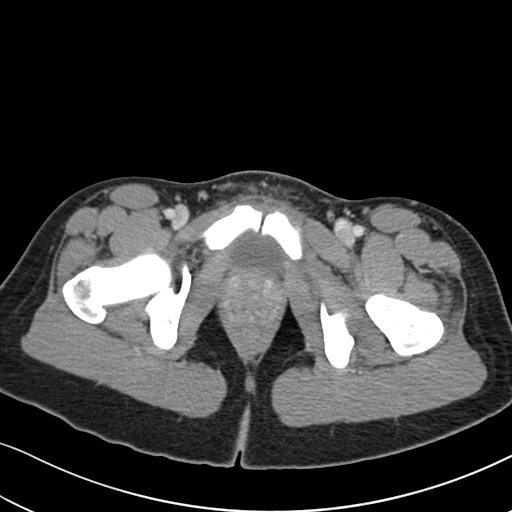
[im 15/83  soft-tissue]
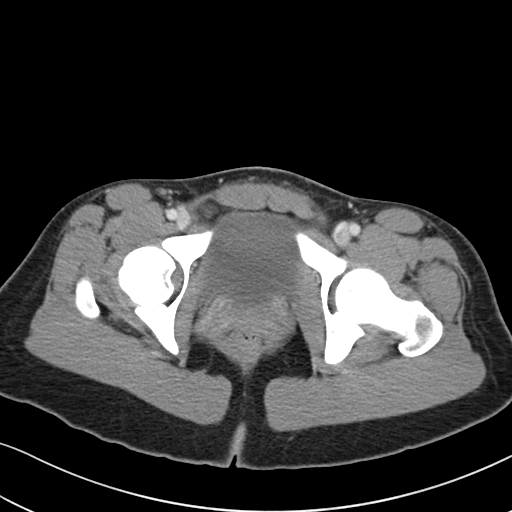
[im 23/83  soft-tissue]
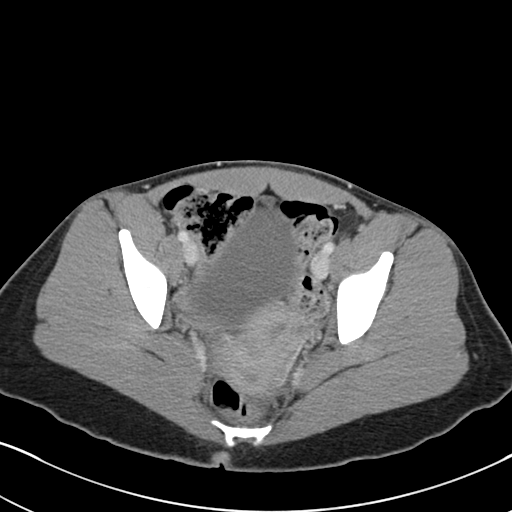
[im 27/83  soft-tissue]
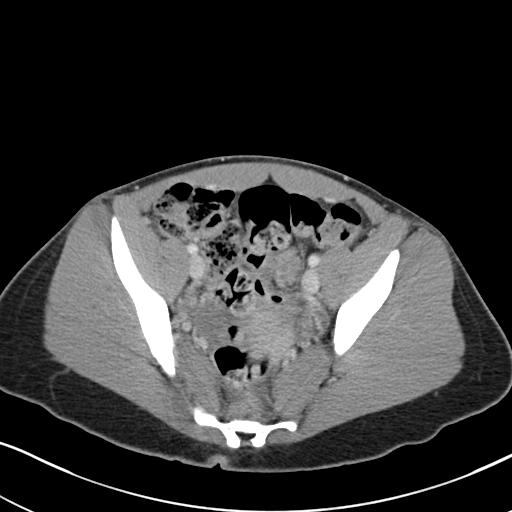
[im 34/83  soft-tissue]
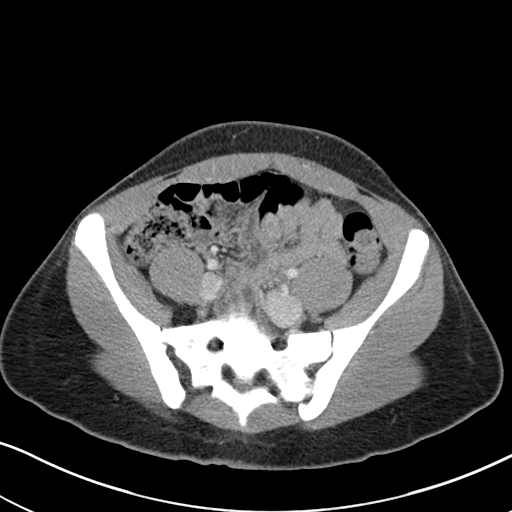
[im 38/83  soft-tissue]
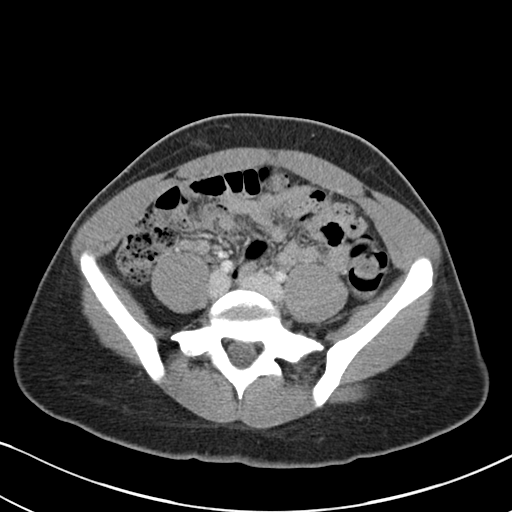
[im 45/83  soft-tissue]
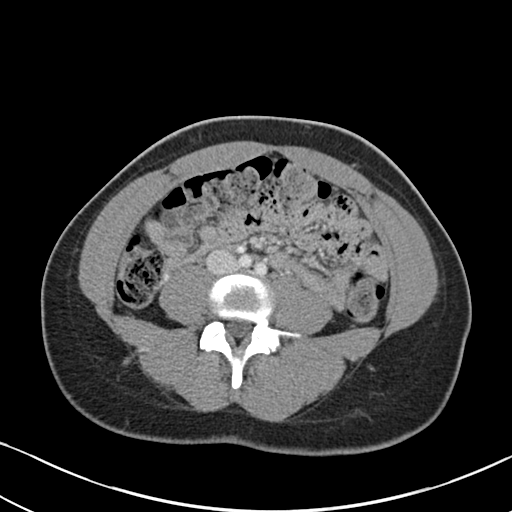
[im 49/83  soft-tissue]
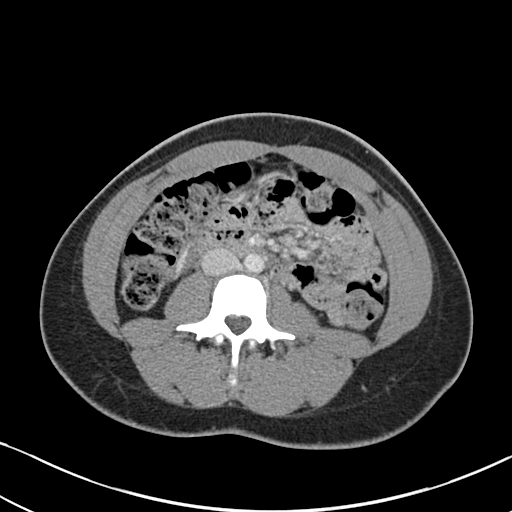
[im 49/83  bone]
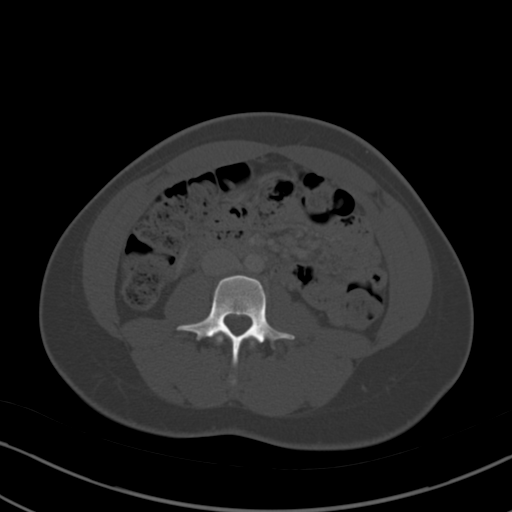
[im 56/83  soft-tissue]
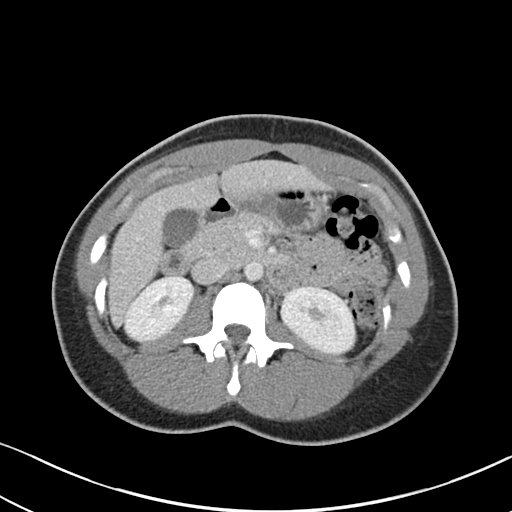
[im 60/83  soft-tissue]
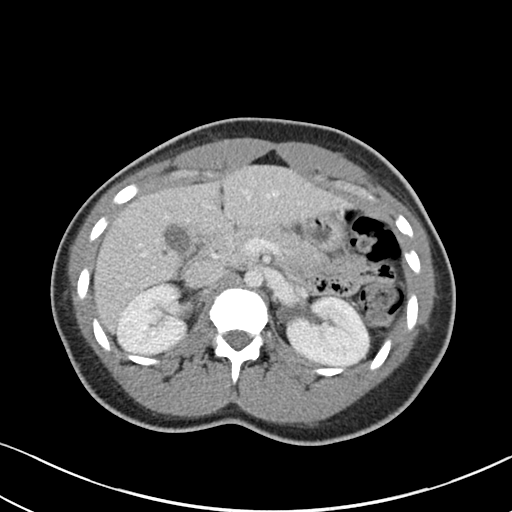
[im 68/83  soft-tissue]
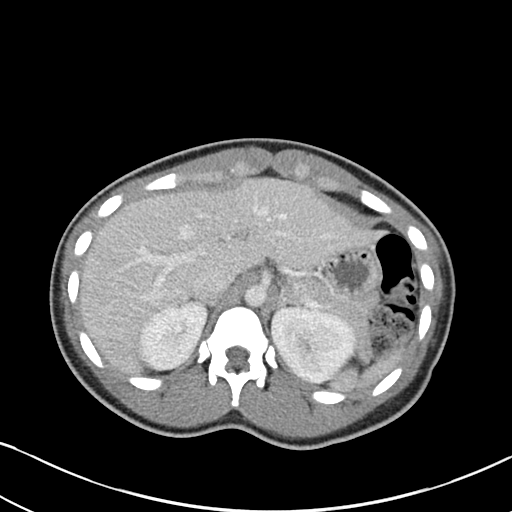
[im 71/83  soft-tissue]
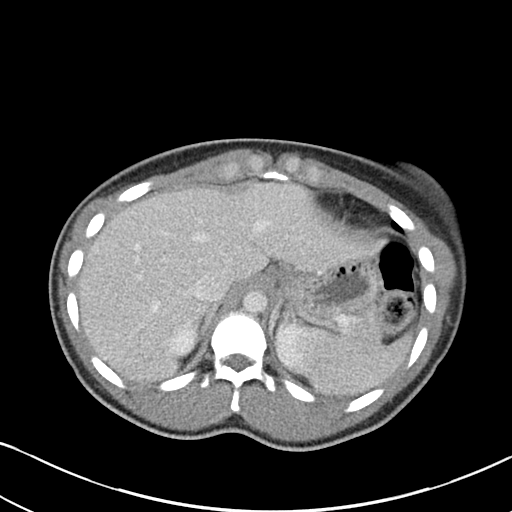
[im 79/83  soft-tissue]
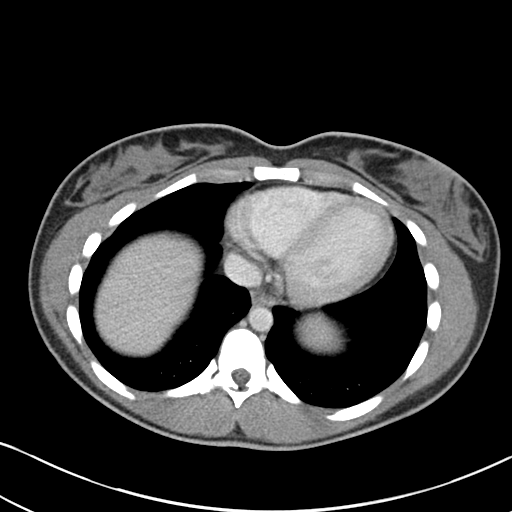

[Series 4: coronal st · coronal · 0.76mm/px · 3 of 72 slices shown]
[im 24/72  soft-tissue]
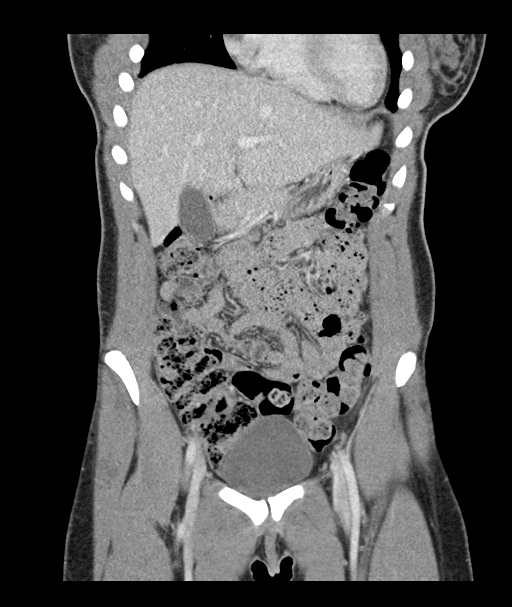
[im 32/72  soft-tissue]
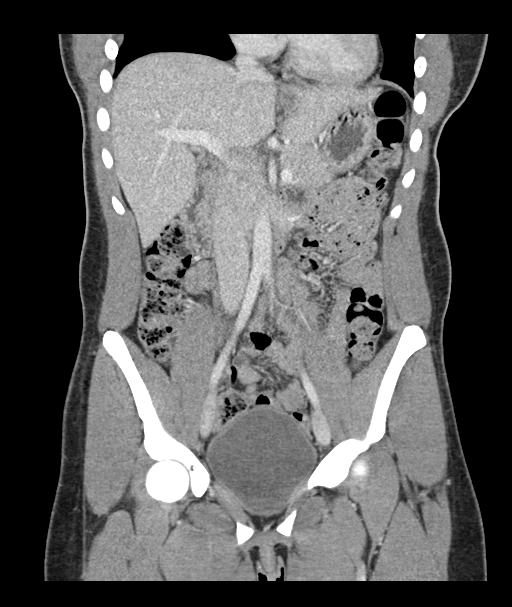
[im 40/72  soft-tissue]
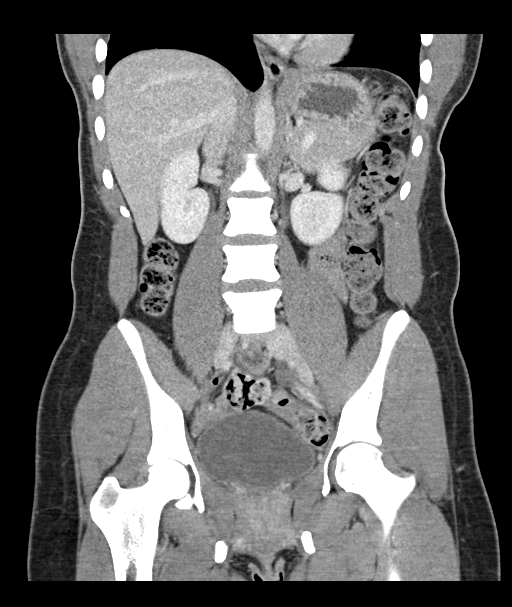

[17 of 46 positions shown; findings below may reference images not displayed]

FINDINGS: Lower chest: Normal.

Hepatobiliary: Subcentimeter hypodensity near the gallbladder fossa
likely a cyst but too small to characterize. Gallbladder and biliary
tree are within normal.

Pancreas: Normal.

Spleen: Normal.

Adrenals/Urinary Tract: Adrenal glands are normal. Kidneys are
normal in size without hydronephrosis or nephrolithiasis. Bladder
and ureters are normal.

Stomach/Bowel: Stomach and small bowel are normal. Appendix is
normal. There is mild fecal retention throughout the colon which is
otherwise within normal.

Vascular/Lymphatic: Normal.

Reproductive: Uterus is just left of midline. Ovaries are within
normal.

Other: Small amount of free fluid in the cul-de-sac likely
physiologic.

Musculoskeletal: Normal.
IMPRESSION: No acute findings in the abdomen/pelvis.

Subcentimeter liver hypodensity too small to characterize, but
likely a cyst.
